# Patient Record
Sex: Male | Born: 2000 | Race: White | Hispanic: No | Marital: Single | Smoking: Never smoker
Health system: Southern US, Community
[De-identification: ages and names within clinical notes are randomized; demographics above are authoritative.]

## PROBLEM LIST (undated history)

## (undated) DIAGNOSIS — F419 Anxiety disorder, unspecified: Secondary | ICD-10-CM

## (undated) DIAGNOSIS — Q761 Klippel-Feil syndrome: Secondary | ICD-10-CM

## (undated) DIAGNOSIS — F1910 Other psychoactive substance abuse, uncomplicated: Secondary | ICD-10-CM

## (undated) DIAGNOSIS — K219 Gastro-esophageal reflux disease without esophagitis: Secondary | ICD-10-CM

## (undated) DIAGNOSIS — F32A Depression, unspecified: Secondary | ICD-10-CM

## (undated) DIAGNOSIS — S060X9A Concussion with loss of consciousness of unspecified duration, initial encounter: Secondary | ICD-10-CM

## (undated) DIAGNOSIS — F329 Major depressive disorder, single episode, unspecified: Secondary | ICD-10-CM

## (undated) DIAGNOSIS — S060XAA Concussion with loss of consciousness status unknown, initial encounter: Secondary | ICD-10-CM

## (undated) HISTORY — DX: Concussion with loss of consciousness of unspecified duration, initial encounter: S06.0X9A

## (undated) HISTORY — DX: Anxiety disorder, unspecified: F41.9

## (undated) HISTORY — DX: Depression, unspecified: F32.A

## (undated) HISTORY — DX: Klippel-Feil syndrome: Q76.1

## (undated) HISTORY — PX: NO PAST SURGERIES: SHX2092

## (undated) HISTORY — DX: Concussion with loss of consciousness status unknown, initial encounter: S06.0XAA

## (undated) HISTORY — DX: Gastro-esophageal reflux disease without esophagitis: K21.9

## (undated) HISTORY — DX: Other psychoactive substance abuse, uncomplicated: F19.10

---
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
## 2019-03-12 ENCOUNTER — Other Ambulatory Visit: Payer: Self-pay | Admitting: Family Medicine

## 2019-04-23 ENCOUNTER — Encounter: Payer: Self-pay | Admitting: Internal Medicine

## 2019-04-27 ENCOUNTER — Ambulatory Visit: Payer: No Typology Code available for payment source | Admitting: Internal Medicine

## 2019-05-02 ENCOUNTER — Other Ambulatory Visit: Payer: Self-pay

## 2019-05-02 ENCOUNTER — Ambulatory Visit (INDEPENDENT_AMBULATORY_CARE_PROVIDER_SITE_OTHER): Payer: No Typology Code available for payment source | Admitting: Gastroenterology

## 2019-05-02 ENCOUNTER — Other Ambulatory Visit (INDEPENDENT_AMBULATORY_CARE_PROVIDER_SITE_OTHER): Payer: No Typology Code available for payment source

## 2019-05-02 ENCOUNTER — Encounter: Payer: Self-pay | Admitting: Gastroenterology

## 2019-05-02 VITALS — BP 138/80 | HR 64 | Temp 97.9°F | Ht 69.75 in | Wt 169.4 lb

## 2019-05-02 DIAGNOSIS — R1013 Epigastric pain: Secondary | ICD-10-CM | POA: Diagnosis not present

## 2019-05-02 DIAGNOSIS — Z01818 Encounter for other preprocedural examination: Secondary | ICD-10-CM

## 2019-05-02 DIAGNOSIS — R634 Abnormal weight loss: Secondary | ICD-10-CM

## 2019-05-02 DIAGNOSIS — K529 Noninfective gastroenteritis and colitis, unspecified: Secondary | ICD-10-CM | POA: Diagnosis not present

## 2019-05-02 LAB — COMPREHENSIVE METABOLIC PANEL
ALT: 19 U/L (ref 0–53)
AST: 14 U/L (ref 0–37)
Albumin: 5 g/dL (ref 3.5–5.2)
Alkaline Phosphatase: 58 U/L (ref 52–171)
BUN: 12 mg/dL (ref 6–23)
CO2: 27 mEq/L (ref 19–32)
Calcium: 9.9 mg/dL (ref 8.4–10.5)
Chloride: 103 mEq/L (ref 96–112)
Creatinine, Ser: 0.95 mg/dL (ref 0.40–1.50)
GFR: 101.08 mL/min (ref >60.00)
Glucose, Bld: 90 mg/dL (ref 70–99)
Potassium: 4.3 mEq/L (ref 3.5–5.1)
Sodium: 139 mEq/L (ref 135–145)
Total Bilirubin: 0.5 mg/dL (ref 0.2–1.2)
Total Protein: 7.8 g/dL (ref 6.0–8.3)

## 2019-05-02 LAB — CBC WITH DIFFERENTIAL/PLATELET
Basophils Absolute: 0 10*3/uL (ref 0.0–0.1)
Basophils Relative: 0.7 % (ref 0.0–3.0)
Eosinophils Absolute: 0.1 10*3/uL (ref 0.0–0.7)
Eosinophils Relative: 0.8 % (ref 0.0–5.0)
HCT: 47.3 % (ref 36.0–49.0)
Hemoglobin: 16 g/dL (ref 12.0–16.0)
Lymphocytes Relative: 33.6 % (ref 24.0–48.0)
Lymphs Abs: 2.3 10*3/uL (ref 0.7–4.0)
MCHC: 33.9 g/dL (ref 31.0–37.0)
MCV: 84.8 fl (ref 78.0–98.0)
Monocytes Absolute: 0.5 10*3/uL (ref 0.1–1.0)
Monocytes Relative: 7.1 % (ref 3.0–12.0)
Neutro Abs: 3.9 10*3/uL (ref 1.4–7.7)
Neutrophils Relative %: 57.8 % (ref 43.0–71.0)
Platelets: 184 10*3/uL (ref 150.0–575.0)
RBC: 5.57 Mil/uL (ref 3.80–5.70)
RDW: 13.7 % (ref 11.4–15.5)
WBC: 6.8 10*3/uL (ref 4.5–13.5)

## 2019-05-02 LAB — TSH: TSH: 1.2 u[IU]/mL (ref 0.40–5.00)

## 2019-05-02 LAB — SEDIMENTATION RATE: Sed Rate: 2 mm/hr (ref 0–15)

## 2019-05-02 LAB — IGA: IgA: 161 mg/dL (ref 68–378)

## 2019-05-02 MED ORDER — NA SULFATE-K SULFATE-MG SULF 17.5-3.13-1.6 GM/177ML PO SOLN: 1.0000 | Freq: Once | ORAL | 0 refills | Status: AC

## 2019-05-02 NOTE — Patient Instructions (Addendum)
If you are age 20 or older, your body mass index should be between 23-30. Your Body mass index is 24.48 kg/m. If this is out of the aforementioned range listed, please consider follow up with your Primary Care Provider.  If you are age 64 or younger, your body mass index should be between 19-25. Your Body mass index is 24.48 kg/m. If this is out of the aformentioned range listed, please consider follow up with your Primary Care Provider.   Your provider has requested that you go to the basement level for lab work before leaving today. Press "B" on the elevator. The lab is located at the first door on the left as you exit the elevator.  Due to recent changes in healthcare laws, you may see the results of your imaging and laboratory studies on MyChart before your provider has had a chance to review them.  We understand that in some cases there may be results that are confusing or concerning to you. Not all laboratory results come back in the same time frame and the provider may be waiting for multiple results in order to interpret others.  Please give [place] 48 hours in order for your provider to thoroughly review all the results before contacting the office for clarification of your results.   You have been scheduled for an abdominal ultrasound at [facility] (1st floor of hospital) on  05-08-2019 at 8am.Please arrive 15 minutes prior to your appointment for registration. Make certain not to have anything to eat or drink 6 hours prior to your appointment. Should you need to reschedule your appointment, please contact radiology at [phone]. This test typically takes about 30 minutes to perform.  You have been scheduled for an endoscopy and colonoscopy. Please follow the written instructions given to you at your visit today. Please pick up your prep supplies at the pharmacy within the next 1-3 days. If you use inhalers (even only as needed), please bring them with you on the day of your  procedure. Your physician has requested that you go to www.startemmi.com and enter the access code given to you at your visit today. This web site gives a general overview about your procedure. However, you should still follow specific instructions given to you by our office regarding your preparation for the procedure.  It was a pleasure to see you today!  Dr. Myrtie Neither

## 2019-05-02 NOTE — Progress Notes (Signed)
[facility] Consult Note:  History: John Lynch 05/02/2019  Referring provider: Laurey Morale, MD  Reason for consult/chief complaint: Diarrhea (x6 months, every morning, tried to change diet), Gastroesophageal Reflux (x6 months, Nexium helped, feel sharp pain in the morning and when eating in the lower esophagus), and Twin sister has IBS   Subjective  HPI: This is a very pleasant 19 year old man referred by primary care and accompanied by his mother regarding chronic abdominal pain and diarrhea.  About 9 or 10 months ago he started having frequent epigastric pain that would often wake him from sleep.  It was initially of a burning quality and thought maybe to be reflux, then evolved into a sharp pain that he feels is "like a knife". PCP telemedicine evaluation April and August 2020 - Rx dicyclomine and nexium. Dicyclomine taken in the morning might help relieve the symptoms, and also the diarrhea that often occurs afterwards.  However, taking it later in the day does not seem to help.  His appetite is dropped off as a result, and he and his mother believe he has lost about 30 pounds over the last year, and he feels that it is continuing to decrease. He denies nausea, vomiting or dysphagia.  Frequent loose stool, no rectal bleeding. 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 They have not noticed any clear or consistent food triggers for the symptoms.   ROS:  Review of Systems  Constitutional: Negative for appetite change and unexpected weight change.  HENT: Negative for mouth sores and voice change.   Eyes: Negative for pain and redness.  Respiratory: Negative for cough and shortness of breath.   Cardiovascular: Negative for chest pain and palpitations.  Genitourinary: Negative for dysuria and hematuria.  Musculoskeletal: Negative for arthralgias and myalgias.  Skin: Negative for pallor and rash.  Neurological:  Negative for weakness and headaches.  Hematological: Negative for adenopathy.   No chronic painful eye redness, rash, swollen and tender joints or lip/facial swelling.  Past Medical History: Past Medical History:  Diagnosis Date  . Anxiety   . Concussion   . Depression   . GERD (gastroesophageal reflux disease)   . Klippel-Feil deformity   . Substance abuse ([place])    Concussions in childhood from playing soccer, no major medical illnesses or hospitalizations.  Past Surgical History: Past Surgical History:  Procedure Laterality Date  . NO PAST SURGERIES       Family History: Family History  Problem Relation Age of Onset  . Hyperlipidemia Father   . Hypertension Father   . Healthy Mother   . Irritable bowel syndrome Sister   . Diabetes Neg Hx   . Heart attack Neg Hx   . Sudden death Neg Hx     Social History: Social History   Socioeconomic History  . Marital status: Single    Spouse name: Not on file  . Number of children: Not on file  . Years of education: Not on file  . Highest education level: Not on file  Occupational History  . Not on file  Tobacco Use  . Smoking status: Never Smoker  . Smokeless tobacco: Never Used  Substance and Sexual Activity  . Alcohol use: No    Alcohol/week: 0.0 standard drinks  . Drug use: Yes    Types: Marijuana    Comment: daily   . Sexual activity: Not on file  Other Topics Concern  . Not on file  Social  History Narrative  . Not on file   Social Determinants of Health   Financial Resource Strain:   . Difficulty of Paying Living Expenses: Not on file  Food Insecurity:   . Worried About Charity fundraiser in the Last Year: Not on file  . Ran Out of Food in the Last Year: Not on file  Transportation Needs:   . Lack of Transportation (Medical): Not on file  . Lack of Transportation (Non-Medical): Not on file  Physical Activity:   . Days of Exercise per Week: Not on file  . Minutes of Exercise per Session: Not on file   Stress:   . Feeling of Stress : Not on file  Social Connections:   . Frequency of Communication with Friends and Family: Not on file  . Frequency of Social Gatherings with Friends and Family: Not on file  . Attends Religious Services: Not on file  . Active Member of Clubs or Organizations: Not on file  . Attends Archivist Meetings: Not on file  . Marital Status: Not on file   Works at a car wash, Interior and spatial designer in IT  Allergies: No Known Allergies  Outpatient Meds: Current Outpatient Medications  Medication Sig Dispense Refill  . dicyclomine (BENTYL) 20 MG tablet TAKE 1 TABLET (20 MG TOTAL) BY MOUTH EVERY 6 (SIX) HOURS AS NEEDED (ABDOMINAL CRAMPS). 60 tablet 5  . Na Sulfate-K Sulfate-Mg Sulf 17.5-3.13-1.6 GM/177ML SOLN Take 1 kit by mouth once for 1 dose. 354 mL 0   No current facility-administered medications for this visit.      ___________________________________________________________________ Objective   Exam:  BP 138/80   Pulse 64   Temp 97.9 F (36.6 C)   Ht 5' 9.75" (1.772 m)   Wt 169 lb 6 oz (76.8 kg)   BMI 24.48 kg/m    General: Well-appearing, pleasant and conversational.  Thin but with no muscle wasting  Eyes: sclera anicteric, no redness  ENT: oral mucosa moist without lesions, no cervical or supraclavicular lymphadenopathy  CV: RRR without murmur, S1/S2, no JVD, no peripheral edema  Resp: clear to auscultation bilaterally, normal RR and effort noted  GI: soft, mild epigastric tenderness, with active bowel sounds. No guarding or palpable organomegaly noted.  Skin; warm and dry, no rash or jaundice noted  Neuro: awake, alert and oriented x 3. Normal gross motor function and fluent speech  Labs:  No data to review, no testing so far for the symptoms  Assessment: Encounter Diagnoses  Name Primary?  . Epigastric pain Yes  . Chronic diarrhea   . Weight loss   . Preprocedural examination    Possible IBS, but character and  location of pain seem atypical for that.  Must consider IBD.  Young healthy patient, therefore peptic ulcer or gallstones less likely.  Consider celiac sprue. He needs further testing to clarify diagnosis and treatment plan  Plan:  Labs today: CBC, CMP, TSH, ESR, TTG and IgA level Right upper quadrant ultrasound to assess for gallstones  EGD and colonoscopy.  He was agreeable after discussion of procedure and risks, and after discussing it with his mother as well.  The benefits and risks of the planned procedure were described in detail with the patient or (when appropriate) their health care proxy.  Risks were outlined as including, but not limited to, bleeding, infection, perforation, adverse medication reaction leading to cardiac or pulmonary decompensation, pancreatitis (if ERCP).  The limitation of incomplete mucosal visualization was also discussed.  No guarantees or  warranties were given.  No change in therapy at this point, pending test results.  Thank you for the courtesy of this consult.  Please call me with any questions or concerns.  Nelida Meuse III  CC: Referring provider noted above

## 2019-05-03 LAB — TISSUE TRANSGLUTAMINASE, IGA: (tTG) Ab, IgA: 1 U/mL

## 2019-05-08 ENCOUNTER — Other Ambulatory Visit: Payer: Self-pay

## 2019-05-08 ENCOUNTER — Ambulatory Visit (HOSPITAL_COMMUNITY)
Admission: RE | Admit: 2019-05-08 | Discharge: 2019-05-08 | Disposition: A | Discharge status: Home / Self Care | Payer: No Typology Code available for payment source | Source: Ambulatory Visit | Attending: Gastroenterology | Admitting: Gastroenterology

## 2019-05-08 DIAGNOSIS — Z01818 Encounter for other preprocedural examination: Secondary | ICD-10-CM | POA: Insufficient documentation

## 2019-05-08 DIAGNOSIS — K529 Noninfective gastroenteritis and colitis, unspecified: Secondary | ICD-10-CM | POA: Diagnosis present

## 2019-05-08 DIAGNOSIS — R634 Abnormal weight loss: Secondary | ICD-10-CM | POA: Diagnosis present

## 2019-05-08 DIAGNOSIS — R1013 Epigastric pain: Secondary | ICD-10-CM | POA: Diagnosis not present

## 2019-05-09 ENCOUNTER — Telehealth: Payer: Self-pay | Admitting: Gastroenterology

## 2019-05-09 NOTE — Telephone Encounter (Signed)
The patients mother was told Dr. Myrtie Neither has not reviewed the labs or the imaging and she would be notified of the results after being reviewed.

## 2019-05-09 NOTE — Telephone Encounter (Signed)
Pt's mother inquired about [place] and lab results.

## 2019-05-16 ENCOUNTER — Encounter: Payer: 59 | Admitting: Gastroenterology

## 2019-06-27 ENCOUNTER — Telehealth: Payer: Self-pay | Admitting: Family Medicine

## 2019-06-27 DIAGNOSIS — F1910 Other psychoactive substance abuse, uncomplicated: Secondary | ICD-10-CM

## 2019-06-27 NOTE — Telephone Encounter (Signed)
Done

## 2019-06-28 ENCOUNTER — Other Ambulatory Visit: Payer: Self-pay

## 2019-07-02 ENCOUNTER — Other Ambulatory Visit: Payer: 59

## 2019-07-02 ENCOUNTER — Other Ambulatory Visit: Payer: Self-pay

## 2019-07-02 DIAGNOSIS — F1910 Other psychoactive substance abuse, uncomplicated: Secondary | ICD-10-CM

## 2019-07-03 LAB — DRUG SCREEN, URINE
Amphetamines, Urine: NEGATIVE ng/mL
Barbiturate screen, urine: NEGATIVE ng/mL
Benzodiazepine Quant, Ur: NEGATIVE ng/mL
Cannabinoid Quant, Ur: POSITIVE ng/mL — AB
Cocaine (Metab.): NEGATIVE ng/mL
Opiate Quant, Ur: NEGATIVE ng/mL
PCP Quant, Ur: NEGATIVE ng/mL

## 2020-06-09 ENCOUNTER — Ambulatory Visit: Payer: 59 | Admitting: Family Medicine

## 2020-06-10 ENCOUNTER — Other Ambulatory Visit: Payer: Self-pay

## 2020-06-11 ENCOUNTER — Encounter: Payer: Self-pay | Admitting: Family Medicine

## 2020-06-11 ENCOUNTER — Ambulatory Visit: Payer: 59 | Admitting: Family Medicine

## 2020-06-11 VITALS — BP 126/80 | HR 71 | Temp 98.0°F | Wt 187.0 lb

## 2020-06-11 DIAGNOSIS — R202 Paresthesia of skin: Secondary | ICD-10-CM | POA: Diagnosis not present

## 2020-06-11 DIAGNOSIS — F900 Attention-deficit hyperactivity disorder, predominantly inattentive type: Secondary | ICD-10-CM | POA: Diagnosis not present

## 2020-06-11 DIAGNOSIS — F419 Anxiety disorder, unspecified: Secondary | ICD-10-CM

## 2020-06-11 MED ORDER — AMPHETAMINE-DEXTROAMPHET ER 10 MG PO CP24: 10.0000 mg | ORAL_CAPSULE | Freq: Every day | ORAL | 0 refills | Status: DC

## 2020-06-11 NOTE — Progress Notes (Signed)
   Subjective:    Patient ID: John Lynch, male    DOB: [birth year], 20 y.o.   MRN: [id number]  HPI Here with his mother to discuss anxiety and what is likely ADHD. He says he has dealt with anxiety for many years, but it seems to have gotten much worse in the past few months. He has a girlfriend and this can generate some anxiety. 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 His sleep and appetite are intact. He admits to smoking marijuana every day and he has done so for several years, though he says he has cut back quite a bit. He denies any depression symptoms. He and his mother agree that he has always had trouble focusing on tasks and completing tasks, and he often forgets things. He also describes 7 episodes in the past year where he suddenly feels a tingling sensation in the feet and hands which then spreads up the arms and legs to the trunk. Then he feels tingling in the face and mouth, and he feels his fingers and toes start to cramp. No shaking or clenching, no LOC. He has learned to make himself relax during these spells, and he can make them stop.   Review of Systems  Constitutional: Negative.   Respiratory: Negative.   Cardiovascular: Negative.   Neurological: Positive for light-headedness and numbness. Negative for dizziness, tremors, seizures, syncope, facial asymmetry, speech difficulty, weakness and headaches.  Psychiatric/Behavioral: Positive for decreased concentration. Negative for agitation, behavioral problems, confusion, dysphoric mood, hallucinations, self-injury, sleep disturbance and suicidal ideas. The patient is nervous/anxious.        Objective:   Physical Exam Constitutional:      Appearance: Normal appearance. He is not ill-appearing.  Cardiovascular:     Rate and Rhythm: Normal rate and regular rhythm.     Pulses: Normal pulses.     Heart sounds: Normal heart sounds.  Pulmonary:     Effort: Pulmonary  effort is normal.     Breath sounds: Normal breath sounds.  Neurological:     General: No focal deficit present.     Mental Status: He is alert and oriented to person, place, and time.  Psychiatric:        Mood and Affect: Mood normal.        Behavior: Behavior normal.        Thought Content: Thought content normal.        Judgment: Judgment normal.           Assessment & Plan:  His spells of tingling seem to be related to his anxiety, and he may be hyperventilating during them. We agreed he has both anxiety and ADHD, and he wanted to treat the ADHD first . He will try Adderall XR 10 mg every morning, and he will follow up here in 3 weeks. We may treat the anxiety as well at some point. I advised him to stop smoking marijuana and to exercise daily.  Gershon Crane, MD

## 2020-07-01 ENCOUNTER — Other Ambulatory Visit: Payer: Self-pay

## 2020-07-01 ENCOUNTER — Ambulatory Visit: Payer: 59 | Admitting: Family Medicine

## 2020-07-01 ENCOUNTER — Encounter: Payer: Self-pay | Admitting: Family Medicine

## 2020-07-01 VITALS — BP 118/78 | HR 102 | Temp 98.1°F | Wt 178.0 lb

## 2020-07-01 DIAGNOSIS — F900 Attention-deficit hyperactivity disorder, predominantly inattentive type: Secondary | ICD-10-CM

## 2020-07-01 DIAGNOSIS — Z23 Encounter for immunization: Secondary | ICD-10-CM

## 2020-07-01 MED ORDER — AMPHETAMINE-DEXTROAMPHETAMINE 10 MG PO TABS: 10.0000 mg | ORAL_TABLET | Freq: Every evening | ORAL | 0 refills | Status: DC

## 2020-07-01 MED ORDER — AMPHETAMINE-DEXTROAMPHET ER 10 MG PO CP24: 10.0000 mg | ORAL_CAPSULE | Freq: Every morning | ORAL | 0 refills | Status: DC

## 2020-07-01 NOTE — Addendum Note (Signed)
Addended by: Carola Rhine on: 07/01/2020 04:42 PM   Modules accepted: Orders

## 2020-07-01 NOTE — Progress Notes (Signed)
   Subjective:    Patient ID: John Lynch, male    DOB: [birth year], 20 y.o.   MRN: [id number]  HPI Here to follow up on ADHD. He has been taking Adderall XR 10 mg each morning about 8:45, and he says it has been very helpful at work. His focus has improved and he is getting his work done on time. His anxiety levels have come down, and he has not had any more spells of tingling all over his body. The only issue is the medication seems to run out about 3:00 in the afternoon. His appetite and sleep are not affected.    Review of Systems  Constitutional: Negative.   Respiratory: Negative.   Psychiatric/Behavioral: Negative.        Objective:   Physical Exam Constitutional:      Appearance: Normal appearance.  Cardiovascular:     Rate and Rhythm: Normal rate and regular rhythm.     Pulses: Normal pulses.     Heart sounds: Normal heart sounds.  Pulmonary:     Effort: Pulmonary effort is normal.     Breath sounds: Normal breath sounds.  Neurological:     Mental Status: He is alert.           Assessment & Plan:  ADHD, much improved. He will continue to take the XR Adderall in the mornings, and we will add IR Adderall 10 mg for the early afternoons. Recheck in 3 months.  Gershon Crane, MD

## 2021-04-27 IMAGING — US US ABDOMEN COMPLETE
1 series · 13 of 25 positions shown · non-contrast
Comparison: None.

CLINICAL DATA: Epigastric pain and weight loss for 6 months.

EXAM:
ABDOMEN ULTRASOUND COMPLETE

[Series 1: us abdomen complete · 13 of 119 slices shown]
[im 1/119]
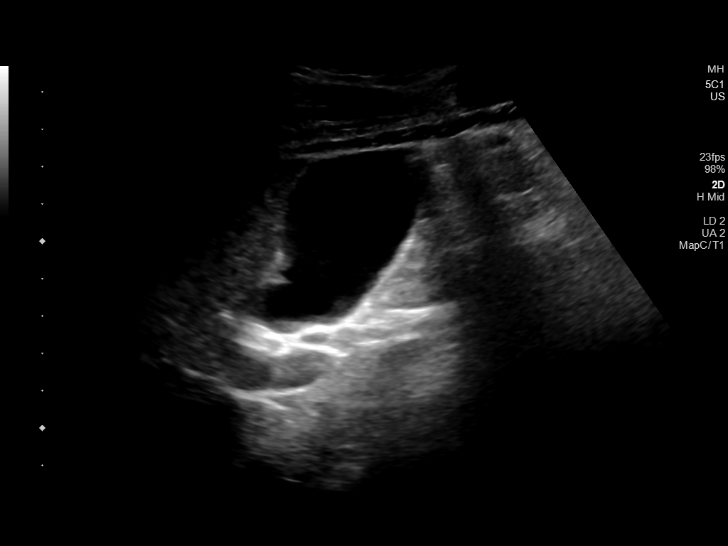
[im 10/119]
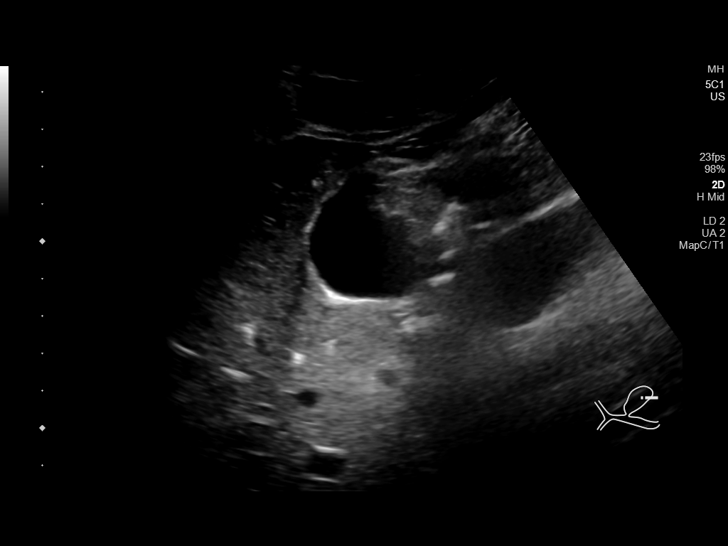
[im 20/119]
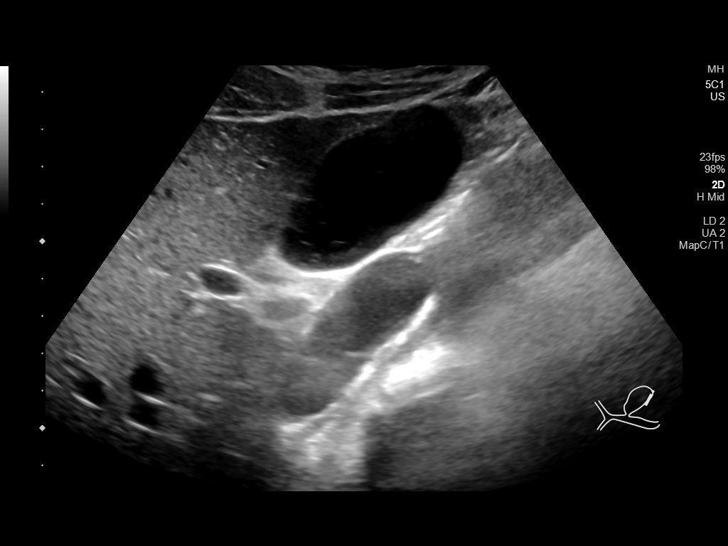
[im 30/119]
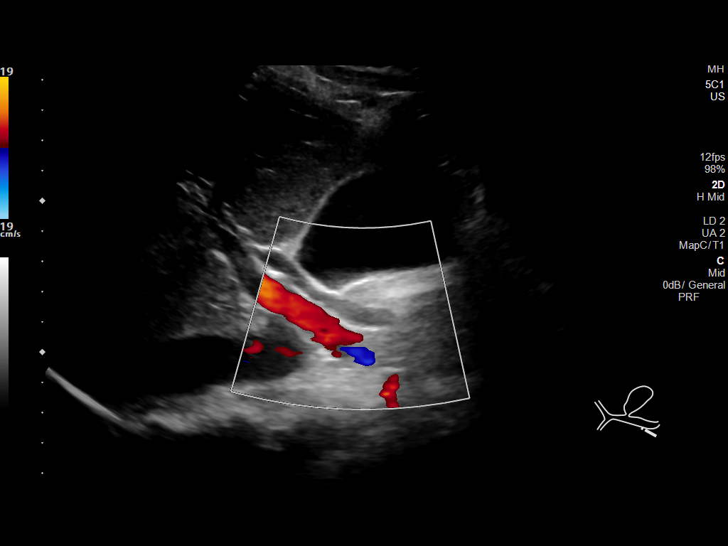
[im 40/119]
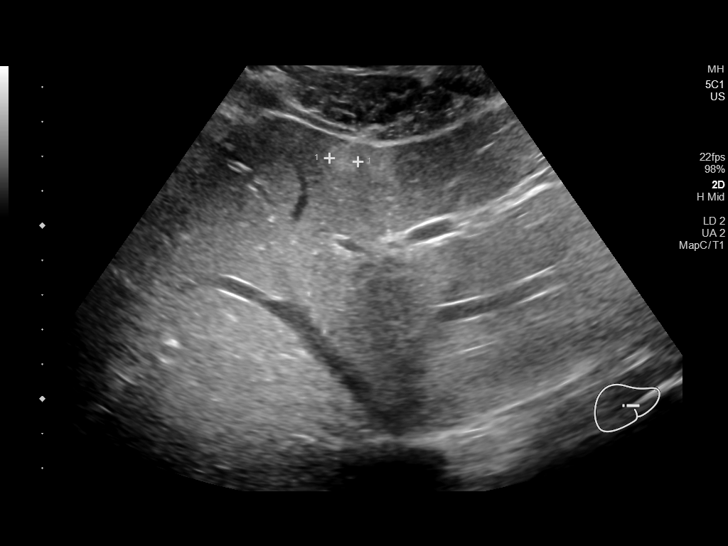
[im 50/119]
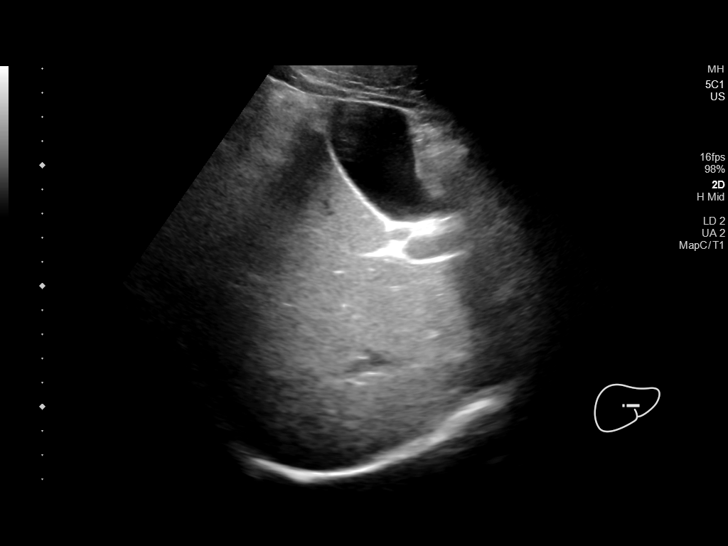
[im 60/119]
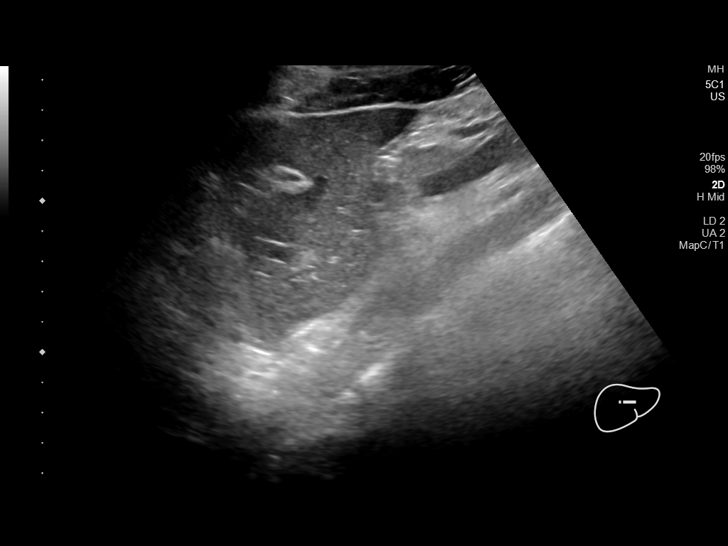
[im 69/119]
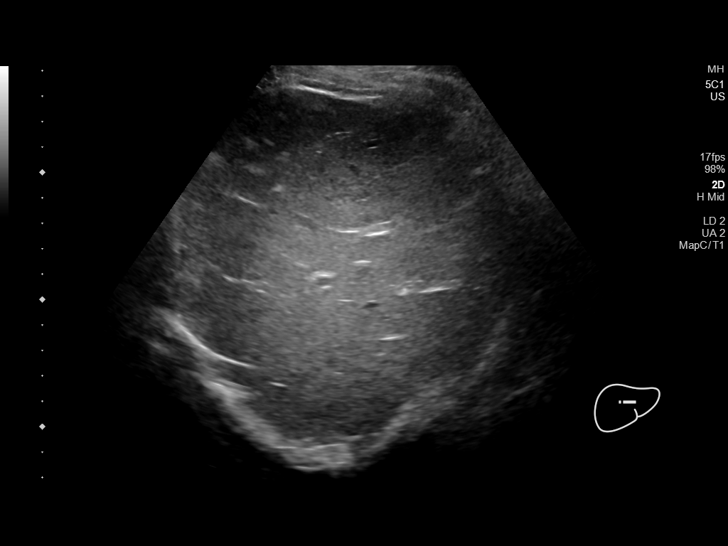
[im 79/119]
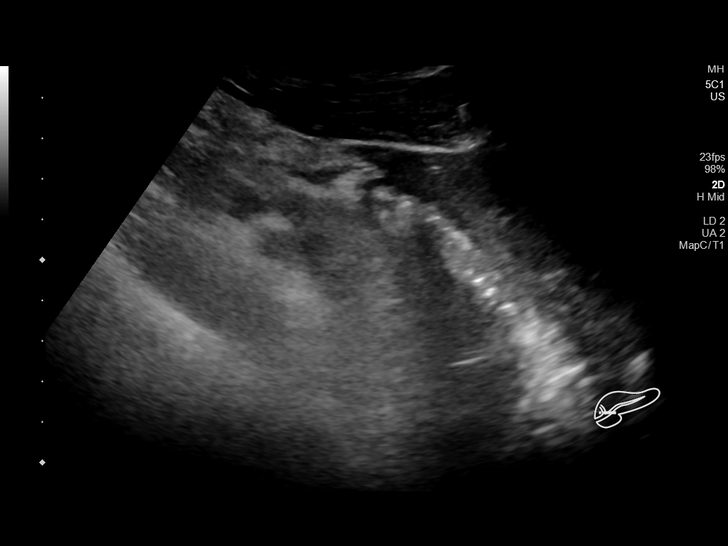
[im 89/119]
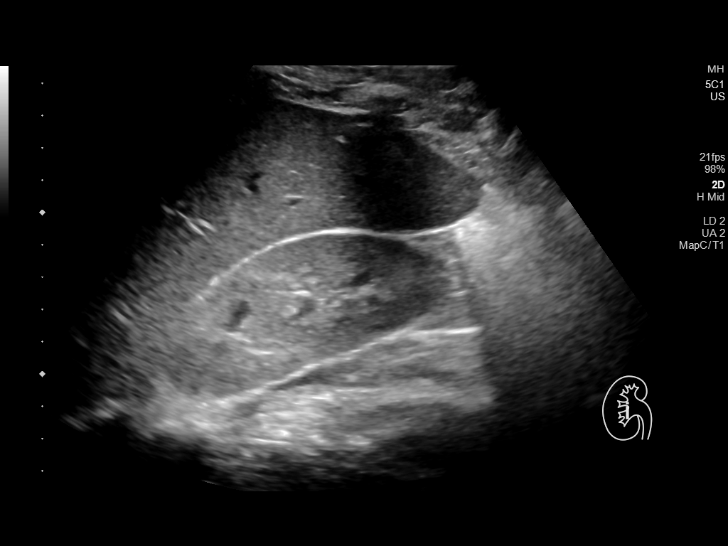
[im 99/119]
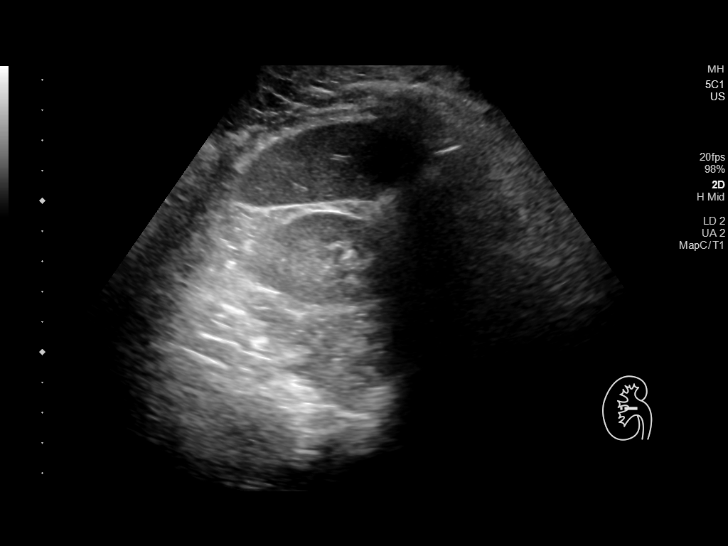
[im 109/119]
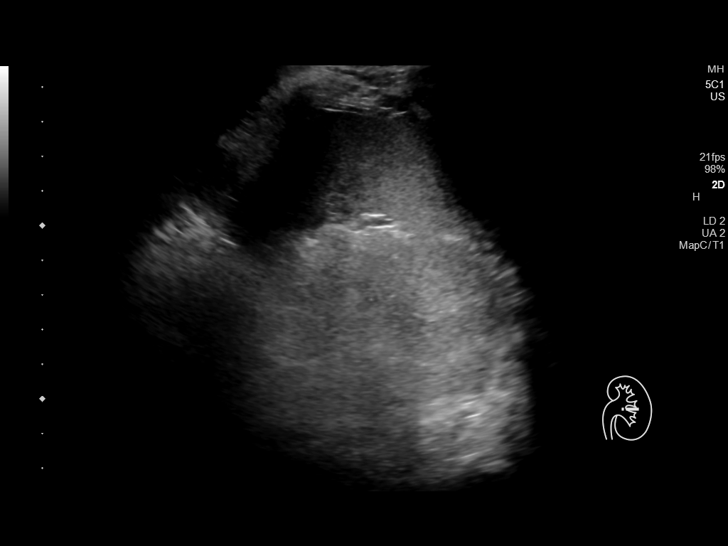
[im 119/119]
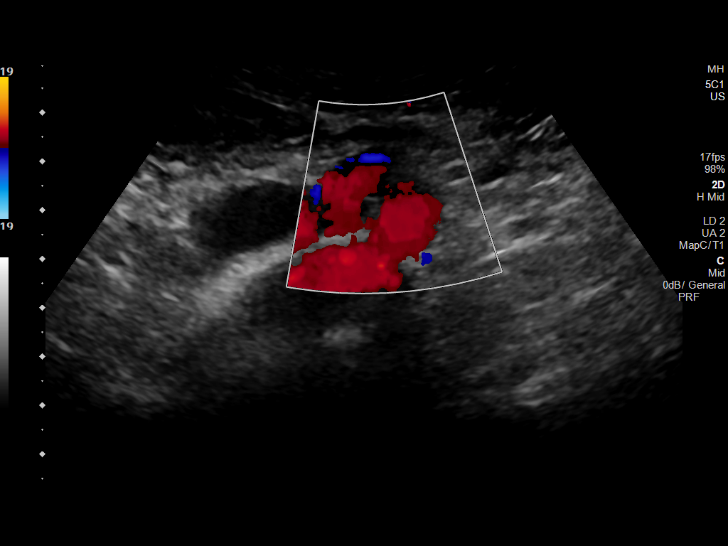

[13 of 25 positions shown; findings below may reference images not displayed]

FINDINGS: Gallbladder: No gallstones or wall thickening visualized. No
sonographic Murphy sign noted by sonographer.

Common bile duct: Diameter: 6 mm, within normal limits.

Liver: Within normal limits in parenchymal echogenicity. A focal
homogeneous hyperechoic lesion measuring 1.3 cm is seen adjacent to
the falciform ligament, which is consistent with focal fatty
infiltration or benign hemangioma. Portal vein is patent on color
Doppler imaging with normal direction of blood flow towards the
liver.

IVC: No abnormality visualized.

Pancreas: Visualized portion unremarkable.

Spleen: Size and appearance within normal limits.

Right Kidney: Length: 11.2 cm. Echogenicity within normal limits. No
mass or hydronephrosis visualized.

Left Kidney: Length: 11.4 cm. Echogenicity within normal limits. No
mass or hydronephrosis visualized.

Abdominal aorta: No aneurysm visualized.

Other findings: None.
IMPRESSION: No evidence of cholelithiasis or biliary ductal dilatation.

Small hyperechoic liver lesion near the falciform ligament,
consistent with focal fatty infiltration or benign hemangioma.

## 2021-10-05 ENCOUNTER — Ambulatory Visit (INDEPENDENT_AMBULATORY_CARE_PROVIDER_SITE_OTHER): Payer: 59 | Admitting: Family Medicine

## 2021-10-05 ENCOUNTER — Encounter: Payer: Self-pay | Admitting: Family Medicine

## 2021-10-05 VITALS — BP 110/62 | HR 68 | Temp 98.9°F | Ht 69.75 in | Wt 167.0 lb

## 2021-10-05 DIAGNOSIS — F900 Attention-deficit hyperactivity disorder, predominantly inattentive type: Secondary | ICD-10-CM | POA: Diagnosis not present

## 2021-10-05 MED ORDER — AMPHETAMINE-DEXTROAMPHET ER 10 MG PO CP24: 10.0000 mg | ORAL_CAPSULE | Freq: Every morning | ORAL | 0 refills | Status: DC

## 2021-10-05 NOTE — Progress Notes (Signed)
   Subjective:    Patient ID: John Lynch, male    DOB: December 03, 2000, 21 y.o.   MRN: [id number]  HPI Here to follow up on ADHD. He did not take any Adderall this summer because he did not need it. In a few weeks however he will be starting classes at [employer] and he wants to get back on it. He thinks the XR dose in the morning will be enough. He will try to do without the afternoon dose. He plans to study psychology and ultimately work in a substance abuse rehab center.    Review of Systems  Constitutional: Negative.   Respiratory: Negative.    Cardiovascular: Negative.   Neurological: Negative.        Objective:   Physical Exam Constitutional:      Appearance: Normal appearance.  Cardiovascular:     Rate and Rhythm: Normal rate and regular rhythm.     Pulses: Normal pulses.     Heart sounds: Normal heart sounds.  Pulmonary:     Effort: Pulmonary effort is normal.     Breath sounds: Normal breath sounds.  Neurological:     Mental Status: He is alert.           Assessment & Plan:  ADHD. We will start back on Adderall XR 10 mg each morning.  Gershon Crane, MD

## 2021-10-14 ENCOUNTER — Telehealth: Payer: Self-pay | Admitting: Family Medicine

## 2021-10-14 MED ORDER — AMPHETAMINE-DEXTROAMPHET ER 10 MG PO CP24: 10.0000 mg | ORAL_CAPSULE | Freq: Every morning | ORAL | 0 refills | Status: DC

## 2021-10-14 NOTE — Telephone Encounter (Signed)
Pt calling, confused about his prescription for amphetamine-dextroamphetamine (ADDERALL XR) 10 MG 24 hr capsule. States pharmacy says he cannot fill until September. Previous prescription were discontinued. Requests call for clarification

## 2021-10-14 NOTE — Telephone Encounter (Signed)
Patient informed of the message below.

## 2021-10-14 NOTE — Telephone Encounter (Signed)
I am not sure what happened. I just sent in a RX for July and for August

## 2022-01-15 ENCOUNTER — Ambulatory Visit (INDEPENDENT_AMBULATORY_CARE_PROVIDER_SITE_OTHER): Payer: 59 | Admitting: Family Medicine

## 2022-01-15 ENCOUNTER — Encounter: Payer: Self-pay | Admitting: Family Medicine

## 2022-01-15 VITALS — BP 120/80 | HR 78 | Temp 98.2°F | Wt 169.0 lb

## 2022-01-15 DIAGNOSIS — F411 Generalized anxiety disorder: Secondary | ICD-10-CM

## 2022-01-15 DIAGNOSIS — K580 Irritable bowel syndrome with diarrhea: Secondary | ICD-10-CM | POA: Diagnosis not present

## 2022-01-15 MED ORDER — SERTRALINE HCL 50 MG PO TABS: 50.0000 mg | ORAL_TABLET | Freq: Every day | ORAL | 3 refills | Status: DC

## 2022-01-15 NOTE — Progress Notes (Signed)
   Subjective:    Patient ID: John Lynch, male    DOB: [birth year], 21 y.o.   MRN: [id number]  HPI Here for a recurrence of symptoms that he had several years ago. At that time he suddenly began to have bouts of chest pressure, SOB, generalized shakiness, and a pins and needles sensation all over his body. At that time we did a full workup that was unrevealing, and we decided that these bouts were due to stress. He and his mother decided against treating for stress, but around the same time he was having trouble with ADHD so we started treating him with Adderall. After that his ADHD was much better controlled and these stress related symptoms went away. He has not used Adderall for some months now , and he feels he no longer needs it. However the symptoms above suddenly returned about 5 days ago. These bouts usually start when he is driving his car. They seem to "come out of nowhere" even we he does not feel particularly stressed. They may last anywhere from 30 minute to several hours. He admits that he has a lot of pressures in his life right now, including work and family. He denies depression symptoms. His appetite and sleep are intact.    Review of Systems  Constitutional: Negative.   Respiratory:  Positive for shortness of breath. Negative for cough, choking and wheezing.   Cardiovascular:  Positive for chest pain. Negative for palpitations and leg swelling.  Gastrointestinal: Negative.   Endocrine: Negative.   Genitourinary: Negative.   Neurological:  Positive for numbness. Negative for dizziness, tremors, seizures, syncope, speech difficulty, weakness, light-headedness and headaches.  Psychiatric/Behavioral:  Negative for agitation, behavioral problems, confusion, decreased concentration, dysphoric mood, hallucinations and sleep disturbance. The patient is nervous/anxious.        Objective:   Physical Exam Constitutional:      Appearance: Normal appearance. He is not ill-appearing.   Cardiovascular:     Rate and Rhythm: Normal rate and regular rhythm.     Pulses: Normal pulses.     Heart sounds: Normal heart sounds.  Pulmonary:     Effort: Pulmonary effort is normal.     Breath sounds: Normal breath sounds.  Neurological:     General: No focal deficit present.     Mental Status: He is alert and oriented to person, place, and time. Mental status is at baseline.  Psychiatric:        Mood and Affect: Mood normal.        Behavior: Behavior normal.        Thought Content: Thought content normal.           Assessment & Plan:  These symptoms are physical manifestations of anxiety, and we agreed to begin treating this. He will try Zoloft 50 mg daily, and we will follow up in 3 weeks.  Alysia Penna, MD

## 2022-02-05 ENCOUNTER — Ambulatory Visit (INDEPENDENT_AMBULATORY_CARE_PROVIDER_SITE_OTHER): Payer: 59 | Admitting: Family Medicine

## 2022-02-05 ENCOUNTER — Encounter: Payer: Self-pay | Admitting: Family Medicine

## 2022-02-05 VITALS — BP 120/80 | HR 63 | Temp 98.3°F | Wt 164.0 lb

## 2022-02-05 DIAGNOSIS — F411 Generalized anxiety disorder: Secondary | ICD-10-CM

## 2022-02-05 NOTE — Progress Notes (Signed)
   Subjective:    Patient ID: John Lynch, male    DOB: Jul 16, 2000, 21 y.o.   MRN: [id number]  HPI Here to follow up on anxiety. We saw him 2 weeks ago, and he was started on Zoloft 50 mg daily. The first week his anxiety was almost worse than before, but now during the second week he definitely sees some improvement. He still has a panic attakc once or twice a day, but they are much briefer than before and less debilitating. He was having trouble sleeping so he started taking Melatonin at bedtime. This has worked well and now he sleeps okay. He notes that he has had more stress in his life the past week because the family cat was let out of the house, and they cannot find her. They have spent hours every day searching the nearby woods for her to no avail.    Review of Systems  Constitutional: Negative.   Respiratory: Negative.    Cardiovascular: Negative.   Psychiatric/Behavioral:  Positive for sleep disturbance. Negative for agitation, behavioral problems, confusion, decreased concentration and dysphoric mood. The patient is nervous/anxious.        Objective:   Physical Exam Constitutional:      General: He is not in acute distress.    Appearance: Normal appearance.  Cardiovascular:     Rate and Rhythm: Normal rate and regular rhythm.     Pulses: Normal pulses.     Heart sounds: Normal heart sounds.  Pulmonary:     Effort: Pulmonary effort is normal.     Breath sounds: Normal breath sounds.  Neurological:     General: No focal deficit present.     Mental Status: He is alert and oriented to person, place, and time.  Psychiatric:        Mood and Affect: Mood normal.        Behavior: Behavior normal.        Thought Content: Thought content normal.           Assessment & Plan:  His anxiety seems to be responding to the Zoloft, but of course he has only been on it for 2 weeks. We agreed that he will stay on this for 2 more weeks, and then we will meet again. He can keep using  the melatonin if he wishes.  Gershon Crane, MD

## 2022-05-19 ENCOUNTER — Other Ambulatory Visit: Payer: Self-pay | Admitting: Family Medicine

## 2022-06-15 ENCOUNTER — Other Ambulatory Visit: Payer: Self-pay | Admitting: Family Medicine

## 2022-09-06 ENCOUNTER — Other Ambulatory Visit: Payer: Self-pay | Admitting: Family Medicine

## 2022-10-03 ENCOUNTER — Other Ambulatory Visit: Payer: Self-pay | Admitting: Family Medicine

## 2022-12-30 ENCOUNTER — Other Ambulatory Visit: Payer: Self-pay | Admitting: Family Medicine

## 2023-05-26 ENCOUNTER — Telehealth: Payer: Self-pay | Admitting: Family Medicine

## 2023-05-26 NOTE — Telephone Encounter (Signed)
 Spoke with pt mother state that pt MyChart has been activated and is now active. Nothing further needed

## 2023-05-26 NOTE — Telephone Encounter (Signed)
 Patient John Lynch is still pending he has a video visit scheduled for tomorrow his mother would like a call back regarding the video visit

## 2023-05-27 ENCOUNTER — Encounter: Payer: Self-pay | Admitting: Family Medicine

## 2023-05-27 ENCOUNTER — Telehealth: Payer: Managed Care, Other (non HMO) | Admitting: Family Medicine

## 2023-05-27 DIAGNOSIS — F411 Generalized anxiety disorder: Secondary | ICD-10-CM

## 2023-05-27 MED ORDER — DESVENLAFAXINE SUCCINATE ER 50 MG PO TB24: 50.0000 mg | ORAL_TABLET | Freq: Every day | ORAL | 0 refills | Status: DC

## 2023-05-27 NOTE — Progress Notes (Signed)
 Subjective:    Patient ID: John Lynch, male    DOB: 03/19/2001, 23 y.o.   MRN: [id number]  HPI Virtual Visit via Video Note  I connected with the patient on 05/27/23 at  9:30 AM EST by a video enabled telemedicine application and verified that I am speaking with the correct person using two identifiers.  Location patient: home Location provider:work or home office Persons participating in the virtual visit: patient, provider  I discussed the limitations of evaluation and management by telemedicine and the availability of in person appointments. The patient expressed understanding and agreed to proceed.   HPI: Here with his mother asking for help with his anxiety. He took Prozac for about 8 months in 2017, and although this did not help him very much, he tolerated it well. Then in 2023 he tried Zoloft. Unfortunately this made him even more anxious, so he stopped it. Now for the past few months his anxiety has been a huge issue. He sleeps fairly well, but as soon as he wakes up the anxiety is there, and it stays with him all day. He has several "panic attacks" every day where he feels SOB and lightheaded, and he almost passes out. He admits to smoking cannibis every day, although he has cut down on this. He has gone from smoking all day to only smoking at night. 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 He has an appointment to see Jamas Lav DNP on March 14 (his sister has worked with her for years), but he wants to try a medication between now and then. His mother notes that his sister takes Pristiq, and this is working very well for her.    ROS: See pertinent positives and negatives per HPI.  Past Medical History:  Diagnosis Date   Anxiety    Concussion    Depression    GERD (gastroesophageal reflux disease)    Klippel-Feil deformity    Substance abuse (HCC)     Past Surgical History:  Procedure Laterality Date   NO PAST SURGERIES      Family History  Problem Relation Age  of Onset   Hyperlipidemia Father    Hypertension Father    Healthy Mother    Irritable bowel syndrome Sister    Diabetes Neg Hx    Heart attack Neg Hx    Sudden death Neg Hx      Current Outpatient Medications:    desvenlafaxine (PRISTIQ) 50 MG 24 hr tablet, Take 1 tablet (50 mg total) by mouth daily., Disp: 30 tablet, Rfl: 0  EXAM:  VITALS per patient if applicable:  GENERAL: alert, oriented, appears well and in no acute distress  HEENT: atraumatic, conjunttiva clear, no obvious abnormalities on inspection of external nose and ears  NECK: normal movements of the head and neck  LUNGS: on inspection no signs of respiratory distress, breathing rate appears normal, no obvious gross SOB, gasping or wheezing  CV: no obvious cyanosis  MS: moves all visible extremities without noticeable abnormality  PSYCH/NEURO: pleasant and cooperative, no obvious depression or anxiety, speech and thought processing grossly intact  ASSESSMENT AND PLAN: Anxiety. We will start him on Pristiq 50 mg every morning. I encouraged him to stop all cannibis use if possible. He will follow up with [place] in 3 weeks.  Gershon Crane, MD  Discussed the following assessment and plan:  No diagnosis found.     I discussed the assessment and treatment plan with the patient. The patient was provided an  opportunity to ask questions and all were answered. The patient agreed with the plan and demonstrated an understanding of the instructions.   The patient was advised to call back or seek an in-person evaluation if the symptoms worsen or if the condition fails to improve as anticipated.      Review of Systems     Objective:   Physical Exam        Assessment & Plan:

## 2023-05-28 ENCOUNTER — Emergency Department (HOSPITAL_COMMUNITY)
Admission: EM | Admit: 2023-05-28 | Discharge: 2023-05-28 | Disposition: A | Discharge status: Home / Self Care | Attending: Emergency Medicine | Admitting: Emergency Medicine

## 2023-05-28 ENCOUNTER — Encounter (HOSPITAL_COMMUNITY): Payer: Self-pay | Admitting: Emergency Medicine

## 2023-05-28 ENCOUNTER — Other Ambulatory Visit: Payer: Self-pay

## 2023-05-28 DIAGNOSIS — F419 Anxiety disorder, unspecified: Secondary | ICD-10-CM | POA: Insufficient documentation

## 2023-05-28 MED ORDER — LORAZEPAM 0.5 MG PO TABS
0.5000 mg | ORAL_TABLET | Freq: Once | ORAL | Status: AC
  Administered 2023-05-28: 0.5 mg via ORAL
  Filled 2023-05-28: qty 1

## 2023-05-28 MED ORDER — HYDROXYZINE HCL 25 MG PO TABS: 25.0000 mg | ORAL_TABLET | Freq: Four times a day (QID) | ORAL | 0 refills | Status: DC | PRN

## 2023-05-28 NOTE — Discharge Instructions (Addendum)
 It was a pleasure taking part in your care.  As we discussed, please follow-up with Dr. Clent Ridges.  Please begin taking hydroxyzine typically at night before bedtime.  Please also follow-up with psychiatry on March 14 as we discussed.  Return to the ED with any new or worsening symptoms.  You may also follow-up with Ottis Stain, licensed therapist.  Her number is [phone]

## 2023-05-28 NOTE — ED Provider Notes (Signed)
 [place] EMERGENCY DEPARTMENT AT [facility] Provider Note   CSN: [id number] Arrival date & time: 05/28/23  1328     History  Chief Complaint  Patient presents with   Anxiety    John Lynch is a 23 y.o. male with medical history depression, substance abuse, anxiety.  Patient presents to the ED for evaluation of anxiety.  States that he is not dealing with anxiety for the last 1 to 2 months.  Reports that he saw PCP yesterday who placed him on Pristiq.  States that he took his initial dose of Pristiq yesterday and then about 4 hours later began to have extreme anxiety, sense of impending doom.  Reports that he developed some mid chest pain, shortness of breath.  States he felt as if he was having a panic attack at that time.  He denies any current chest pain or shortness of breath.  States that he has a follow-up appointment with psychiatry this week as well as PCP Dr. Clent Ridges on Monday.  Has not taken any additional doses of Pristiq since symptom onset.  Endorsing anxiety currently.  Denies SI, HI, AVH.  Denies any chest pain, shortness of breath, nausea vomiting, abdominal pain.   Anxiety       Home Medications Prior to Admission medications   Medication Sig Start Date End Date Taking? Authorizing Provider  hydrOXYzine (ATARAX) 25 MG tablet Take 1 tablet (25 mg total) by mouth every 6 (six) hours as needed. 05/28/23  Yes Al Decant, PA-C  desvenlafaxine (PRISTIQ) 50 MG 24 hr tablet Take 1 tablet (50 mg total) by mouth daily. 05/27/23   Nelwyn Salisbury, MD      Allergies    Patient has no known allergies.    Review of Systems   Review of Systems  Psychiatric/Behavioral:  The patient is nervous/anxious.   All other systems reviewed and are negative.   Physical Exam Updated Vital Signs BP 122/73   Pulse 84   Temp 98.1 F (36.7 C) (Oral)   Resp 14   SpO2 98%  Physical Exam Vitals and nursing note reviewed.  Constitutional:      General: He is not in acute  distress.    Appearance: He is well-developed.  HENT:     Head: Normocephalic and atraumatic.  Eyes:     Conjunctiva/sclera: Conjunctivae normal.  Cardiovascular:     Rate and Rhythm: Normal rate and regular rhythm.     Heart sounds: No murmur heard. Pulmonary:     Effort: Pulmonary effort is normal. No respiratory distress.     Breath sounds: Normal breath sounds.  Abdominal:     Palpations: Abdomen is soft.     Tenderness: There is no abdominal tenderness.  Musculoskeletal:        General: No swelling.     Cervical back: Neck supple.  Skin:    General: Skin is warm and dry.     Capillary Refill: Capillary refill takes less than 2 seconds.  Neurological:     Mental Status: He is alert and oriented to person, place, and time.  Psychiatric:        Mood and Affect: Mood normal.        Behavior: Behavior normal.     Comments: Patient behaving normally.  No rapid or pressured speech.  Alert and oriented x 4.     ED Results / Procedures / Treatments   Labs (all labs ordered are listed, but only abnormal results are displayed) Labs  Reviewed - No data to display  EKG None  Radiology No results found.  Procedures Procedures   Medications Ordered in ED Medications  LORazepam (ATIVAN) tablet 0.5 mg (has no administration in time range)    ED Course/ Medical Decision Making/ A&P  Medical Decision Making Risk Prescription drug management.   23 year old who presents for evaluation.  Please see HPI for further details.  On examination the patient is afebrile and nontachycardic.  Lung sounds clear bilaterally, not hypoxic.  Abdomen is soft and compressible.  Neurological examinations at baseline.  Patient acting appropriately here.  No rapid or pressured speech.  Reporting anxiety but denies any physical complaints.  Patient and patient mother at the bedside had imaging and lab tests offered but they report they did not feel as if this is necessary.  Patient EKG is  nonischemic and shows normal sinus rhythm.  At this time, had long discussion with patient and patient mother.  Will have patient follow-up with his PCP on Monday and psychiatry on the 14th.  Have provided patient with 0.5 mg Ativan for a one-time dose.  Will send him home with hydroxyzine.  He was advised to return to the ED with any new or worsening symptoms and he voiced understanding.  Stable to discharge.   Final Clinical Impression(s) / ED Diagnoses Final diagnoses:  Anxiety    Rx / DC Orders ED Discharge Orders          Ordered    hydrOXYzine (ATARAX) 25 MG tablet  Every 6 hours PRN        05/28/23 1501              Clent Ridges 05/28/23 1503    Terald Sleeper, MD 05/28/23 2007

## 2023-05-28 NOTE — ED Triage Notes (Addendum)
 Patient presents due severe anxiety. The anxiety is causing chests tightness and dyspnea. He has dealt with anxiety over the past year and is currently taking medication for it. Patient also complains of tightness in his traps.

## 2023-05-30 ENCOUNTER — Encounter: Payer: Self-pay | Admitting: Family Medicine

## 2023-05-30 ENCOUNTER — Telehealth (INDEPENDENT_AMBULATORY_CARE_PROVIDER_SITE_OTHER): Admitting: Family Medicine

## 2023-05-30 DIAGNOSIS — F411 Generalized anxiety disorder: Secondary | ICD-10-CM | POA: Diagnosis not present

## 2023-05-30 MED ORDER — LORAZEPAM 1 MG PO TABS: 1.0000 mg | ORAL_TABLET | Freq: Four times a day (QID) | ORAL | 0 refills | Status: AC | PRN

## 2023-05-30 MED ORDER — HYDROXYZINE HCL 25 MG PO TABS: 25.0000 mg | ORAL_TABLET | Freq: Four times a day (QID) | ORAL | 0 refills | Status: AC | PRN

## 2023-05-30 NOTE — Progress Notes (Signed)
 Subjective:    Patient ID: John Lynch, male    DOB: September 20, 2000, 23 y.o.   MRN: [id number]  HPI Virtual Visit via Video Note  I connected with the patient on 05/30/23 at  3:45 PM EST by a video enabled telemedicine application and verified that I am speaking with the correct person using two identifiers.  Location patient: home Location provider:work or home office Persons participating in the virtual visit: patient, provider  I discussed the limitations of evaluation and management by telemedicine and the availability of in person appointments. The patient expressed understanding and agreed to proceed.   HPI: Here with his mother to follow up on anxiety. At our last visit on 05-27-23 we tried him on Pristiq 50 mg daily. However after the first dose he became even more anxious, and he ended up going to the ED on 05-28-23 for a full blown panic attack. They gave him a dose of Ativan, and this helped him to calm down. He was sent home with a small supply of Hydroxyzine 25 mg. These help get his anxiety levels down to where they are manageable which he describes as a "3 or 4 out of 10". He is scheduled to meet with Jamas Lav NP on 06-10-23 for this. He asks for a small supply of Ativan to keep on hand in case he has another panic attack. He asks that we totally avoid any medication in the future that would boost his serotonin level.    ROS: See pertinent positives and negatives per HPI.  Past Medical History:  Diagnosis Date   Anxiety    Concussion    Depression    GERD (gastroesophageal reflux disease)    Klippel-Feil deformity    Substance abuse (HCC)     Past Surgical History:  Procedure Laterality Date   NO PAST SURGERIES      Family History  Problem Relation Age of Onset   Hyperlipidemia Father    Hypertension Father    Healthy Mother    Irritable bowel syndrome Sister    Diabetes Neg Hx    Heart attack Neg Hx    Sudden death Neg Hx      Current Outpatient  Medications:    LORazepam (ATIVAN) 1 MG tablet, Take 1 tablet (1 mg total) by mouth every 6 (six) hours as needed for anxiety., Disp: 10 tablet, Rfl: 0   hydrOXYzine (ATARAX) 25 MG tablet, Take 1 tablet (25 mg total) by mouth every 6 (six) hours as needed for anxiety., Disp: 120 tablet, Rfl: 0  EXAM:  VITALS per patient if applicable:  GENERAL: alert, oriented, appears well and in no acute distress  HEENT: atraumatic, conjunttiva clear, no obvious abnormalities on inspection of external nose and ears  NECK: normal movements of the head and neck  LUNGS: on inspection no signs of respiratory distress, breathing rate appears normal, no obvious gross SOB, gasping or wheezing  CV: no obvious cyanosis  MS: moves all visible extremities without noticeable abnormality  PSYCH/NEURO: pleasant and cooperative, no obvious depression or anxiety, speech and thought processing grossly intact  ASSESSMENT AND PLAN: Anxiety. He can use Hydroxyzine every 6 hours as needed. We will also provde #10 tablets of Lorazepam 1 mg to use only in case he gets another panic attack.  Gershon Crane, MD  Discussed the following assessment and plan:  No diagnosis found.     I discussed the assessment and treatment plan with the patient. The patient was provided an opportunity to ask  questions and all were answered. The patient agreed with the plan and demonstrated an understanding of the instructions.   The patient was advised to call back or seek an in-person evaluation if the symptoms worsen or if the condition fails to improve as anticipated.      Review of Systems     Objective:   Physical Exam        Assessment & Plan:

## 2023-10-17 ENCOUNTER — Telehealth: Payer: Self-pay | Admitting: Family Medicine

## 2023-10-17 DIAGNOSIS — Z0000 Encounter for general adult medical examination without abnormal findings: Secondary | ICD-10-CM

## 2023-10-17 NOTE — Telephone Encounter (Signed)
 Done

## 2023-10-18 ENCOUNTER — Other Ambulatory Visit (INDEPENDENT_AMBULATORY_CARE_PROVIDER_SITE_OTHER)

## 2023-10-18 DIAGNOSIS — Z0000 Encounter for general adult medical examination without abnormal findings: Secondary | ICD-10-CM

## 2023-10-18 LAB — CBC WITH DIFFERENTIAL/PLATELET
Basophils Absolute: 0 K/uL (ref 0.0–0.1)
Basophils Relative: 0.3 % (ref 0.0–3.0)
Eosinophils Absolute: 0.1 K/uL (ref 0.0–0.7)
Eosinophils Relative: 1.5 % (ref 0.0–5.0)
HCT: 46.9 % (ref 39.0–52.0)
Hemoglobin: 15.6 g/dL (ref 13.0–17.0)
Lymphocytes Relative: 37.5 % (ref 12.0–46.0)
Lymphs Abs: 2.3 K/uL (ref 0.7–4.0)
MCHC: 33.3 g/dL (ref 30.0–36.0)
MCV: 84.6 fl (ref 78.0–100.0)
Monocytes Absolute: 0.6 K/uL (ref 0.1–1.0)
Monocytes Relative: 9 % (ref 3.0–12.0)
Neutro Abs: 3.2 K/uL (ref 1.4–7.7)
Neutrophils Relative %: 51.7 % (ref 43.0–77.0)
Platelets: 195 K/uL (ref 150.0–400.0)
RBC: 5.54 Mil/uL (ref 4.22–5.81)
RDW: 13.6 % (ref 11.5–15.5)
WBC: 6.1 K/uL (ref 4.0–10.5)

## 2023-10-18 LAB — HEPATIC FUNCTION PANEL
ALT: 31 U/L (ref 0–53)
AST: 23 U/L (ref 0–37)
Albumin: 4.6 g/dL (ref 3.5–5.2)
Alkaline Phosphatase: 70 U/L (ref 39–117)
Bilirubin, Direct: 0.1 mg/dL (ref 0.0–0.3)
Total Bilirubin: 0.3 mg/dL (ref 0.2–1.2)
Total Protein: 6.8 g/dL (ref 6.0–8.3)

## 2023-10-18 LAB — LIPID PANEL
Cholesterol: 166 mg/dL (ref 0–200)
HDL: 49.9 mg/dL (ref >39.00)
LDL Cholesterol: 91 mg/dL (ref 0–99)
NonHDL: 116.3
Total CHOL/HDL Ratio: 3
Triglycerides: 126 mg/dL (ref 0.0–149.0)
VLDL: 25.2 mg/dL (ref 0.0–40.0)

## 2023-10-18 LAB — BASIC METABOLIC PANEL WITH GFR
BUN: 10 mg/dL (ref 6–23)
CO2: 28 meq/L (ref 19–32)
Calcium: 9.6 mg/dL (ref 8.4–10.5)
Chloride: 104 meq/L (ref 96–112)
Creatinine, Ser: 0.89 mg/dL (ref 0.40–1.50)
GFR: 119.99 mL/min (ref >60.00)
Glucose, Bld: 109 mg/dL — ABNORMAL HIGH (ref 70–99)
Potassium: 4.2 meq/L (ref 3.5–5.1)
Sodium: 140 meq/L (ref 135–145)

## 2023-10-18 LAB — HEMOGLOBIN A1C: Hgb A1c MFr Bld: 5.6 % (ref 4.6–6.5)

## 2023-10-18 LAB — TSH: TSH: 1.53 u[IU]/mL (ref 0.35–5.50)

## 2023-10-19 ENCOUNTER — Ambulatory Visit: Payer: Self-pay | Admitting: Family Medicine

## 2023-10-19 DIAGNOSIS — R5383 Other fatigue: Secondary | ICD-10-CM

## 2023-10-25 NOTE — Telephone Encounter (Signed)
 Done
